# Patient Record
Sex: Male | Born: 1995 | Race: White | Hispanic: No | Marital: Single | Smoking: Never smoker
Health system: Southern US, Community
[De-identification: ages and names within clinical notes are randomized; demographics above are authoritative.]

## PROBLEM LIST (undated history)

## (undated) DIAGNOSIS — N342 Other urethritis: Principal | ICD-10-CM

## (undated) DIAGNOSIS — Z973 Presence of spectacles and contact lenses: Secondary | ICD-10-CM

## (undated) DIAGNOSIS — S83209A Unspecified tear of unspecified meniscus, current injury, unspecified knee, initial encounter: Secondary | ICD-10-CM

## (undated) DIAGNOSIS — Z9889 Other specified postprocedural states: Secondary | ICD-10-CM

## (undated) DIAGNOSIS — K219 Gastro-esophageal reflux disease without esophagitis: Secondary | ICD-10-CM

## (undated) DIAGNOSIS — F32A Depression, unspecified: Secondary | ICD-10-CM

## (undated) DIAGNOSIS — M25561 Pain in right knee: Secondary | ICD-10-CM

## (undated) DIAGNOSIS — F329 Major depressive disorder, single episode, unspecified: Secondary | ICD-10-CM

## (undated) DIAGNOSIS — S8991XD Unspecified injury of right lower leg, subsequent encounter: Secondary | ICD-10-CM

## (undated) HISTORY — DX: Other specified postprocedural states: Z98.890

## (undated) HISTORY — DX: Other urethritis: N34.2

## (undated) HISTORY — PX: OTHER SURGICAL HISTORY: SHX169

## (undated) HISTORY — DX: Unspecified injury of right lower leg, subsequent encounter: S89.91XD

## (undated) HISTORY — DX: Unspecified tear of unspecified meniscus, current injury, unspecified knee, initial encounter: S83.209A

---
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
## 2014-07-16 ENCOUNTER — Ambulatory Visit: Payer: Self-pay | Admitting: Family Medicine

## 2014-11-13 ENCOUNTER — Ambulatory Visit: Payer: Self-pay | Admitting: Otolaryngology

## 2015-03-02 LAB — SURGICAL PATHOLOGY

## 2015-04-03 ENCOUNTER — Other Ambulatory Visit: Payer: Self-pay | Admitting: Orthopedic Surgery

## 2015-04-03 DIAGNOSIS — M2391 Unspecified internal derangement of right knee: Secondary | ICD-10-CM

## 2015-04-14 ENCOUNTER — Ambulatory Visit
Admission: RE | Admit: 2015-04-14 | Discharge: 2015-04-14 | Disposition: A | Discharge status: Home / Self Care | Payer: Managed Care, Other (non HMO) | Source: Ambulatory Visit | Attending: Orthopedic Surgery | Admitting: Orthopedic Surgery

## 2015-04-14 DIAGNOSIS — M9420 Chondromalacia, unspecified site: Secondary | ICD-10-CM | POA: Insufficient documentation

## 2015-04-14 DIAGNOSIS — M25561 Pain in right knee: Secondary | ICD-10-CM | POA: Diagnosis present

## 2015-04-14 DIAGNOSIS — M2391 Unspecified internal derangement of right knee: Secondary | ICD-10-CM

## 2015-04-14 DIAGNOSIS — X58XXXA Exposure to other specified factors, initial encounter: Secondary | ICD-10-CM | POA: Diagnosis not present

## 2015-04-14 DIAGNOSIS — S83241A Other tear of medial meniscus, current injury, right knee, initial encounter: Secondary | ICD-10-CM | POA: Insufficient documentation

## 2015-04-24 DIAGNOSIS — S83209A Unspecified tear of unspecified meniscus, current injury, unspecified knee, initial encounter: Secondary | ICD-10-CM

## 2015-04-24 DIAGNOSIS — S8991XD Unspecified injury of right lower leg, subsequent encounter: Secondary | ICD-10-CM

## 2015-04-24 HISTORY — DX: Unspecified tear of unspecified meniscus, current injury, unspecified knee, initial encounter: S83.209A

## 2015-04-24 HISTORY — DX: Unspecified injury of right lower leg, subsequent encounter: S89.91XD

## 2015-05-27 ENCOUNTER — Encounter: Payer: Self-pay | Admitting: *Deleted

## 2015-06-05 ENCOUNTER — Ambulatory Visit: Payer: Managed Care, Other (non HMO) | Admitting: Student in an Organized Health Care Education/Training Program

## 2015-06-05 ENCOUNTER — Ambulatory Visit
Admission: RE | Admit: 2015-06-05 | Discharge: 2015-06-05 | Disposition: A | Discharge status: Home / Self Care | Payer: Managed Care, Other (non HMO) | Source: Ambulatory Visit | Attending: Unknown Physician Specialty | Admitting: Unknown Physician Specialty

## 2015-06-05 ENCOUNTER — Encounter
Admission: RE | Disposition: A | Discharge status: Home / Self Care | Payer: Self-pay | Source: Ambulatory Visit | Attending: Unknown Physician Specialty

## 2015-06-05 ENCOUNTER — Encounter: Payer: Self-pay | Admitting: Anesthesiology

## 2015-06-05 DIAGNOSIS — Z807 Family history of other malignant neoplasms of lymphoid, hematopoietic and related tissues: Secondary | ICD-10-CM | POA: Insufficient documentation

## 2015-06-05 DIAGNOSIS — Z803 Family history of malignant neoplasm of breast: Secondary | ICD-10-CM | POA: Insufficient documentation

## 2015-06-05 DIAGNOSIS — M25561 Pain in right knee: Secondary | ICD-10-CM | POA: Insufficient documentation

## 2015-06-05 HISTORY — DX: Depression, unspecified: F32.A

## 2015-06-05 HISTORY — DX: Presence of spectacles and contact lenses: Z97.3

## 2015-06-05 HISTORY — DX: Gastro-esophageal reflux disease without esophagitis: K21.9

## 2015-06-05 HISTORY — PX: KNEE ARTHROSCOPY WITH MENISCAL REPAIR: SHX5653

## 2015-06-05 HISTORY — DX: Major depressive disorder, single episode, unspecified: F32.9

## 2015-06-05 HISTORY — DX: Pain in right knee: M25.561

## 2015-06-05 SURGERY — ARTHROSCOPY, KNEE, WITH MENISCUS REPAIR
Anesthesia: General | Laterality: Right | Wound class: Clean

## 2015-06-05 MED ORDER — MIDAZOLAM HCL 5 MG/5ML IJ SOLN
INTRAMUSCULAR | Status: DC | PRN
  Administered 2015-06-05: 2 mg via INTRAVENOUS

## 2015-06-05 MED ORDER — PROMETHAZINE HCL 25 MG/ML IJ SOLN: 6.2500 mg | INTRAMUSCULAR | Status: DC | PRN

## 2015-06-05 MED ORDER — HYDROCODONE-ACETAMINOPHEN 7.5-325 MG PO TABS: 1.0000 | ORAL_TABLET | Freq: Once | ORAL | Status: DC | PRN

## 2015-06-05 MED ORDER — LACTATED RINGERS IV SOLN
INTRAVENOUS | Status: DC
  Administered 2015-06-05: 07:00:00 via INTRAVENOUS

## 2015-06-05 MED ORDER — ONDANSETRON HCL 4 MG/2ML IJ SOLN
INTRAMUSCULAR | Status: DC | PRN
  Administered 2015-06-05: 4 mg via INTRAVENOUS

## 2015-06-05 MED ORDER — NORCO 5-325 MG PO TABS: 1.0000 | ORAL_TABLET | Freq: Four times a day (QID) | ORAL | Status: DC | PRN

## 2015-06-05 MED ORDER — HYDROMORPHONE HCL 1 MG/ML IJ SOLN: 0.2500 mg | INTRAMUSCULAR | Status: DC | PRN

## 2015-06-05 MED ORDER — BUPIVACAINE HCL (PF) 0.5 % IJ SOLN
INTRAMUSCULAR | Status: DC | PRN
Start: 2015-06-05 — End: 2015-06-05
  Administered 2015-06-05: 20 mL

## 2015-06-05 MED ORDER — DEXAMETHASONE SODIUM PHOSPHATE 4 MG/ML IJ SOLN
INTRAMUSCULAR | Status: DC | PRN
  Administered 2015-06-05: 8 mg via INTRAVENOUS

## 2015-06-05 MED ORDER — FENTANYL CITRATE (PF) 100 MCG/2ML IJ SOLN
INTRAMUSCULAR | Status: DC | PRN
  Administered 2015-06-05: 25 ug via INTRAVENOUS
  Administered 2015-06-05: 50 ug via INTRAVENOUS
  Administered 2015-06-05: 25 ug via INTRAVENOUS

## 2015-06-05 MED ORDER — PROPOFOL 10 MG/ML IV BOLUS
INTRAVENOUS | Status: DC | PRN
  Administered 2015-06-05: 200 mg via INTRAVENOUS

## 2015-06-05 MED ORDER — LIDOCAINE HCL (CARDIAC) 20 MG/ML IV SOLN
INTRAVENOUS | Status: DC | PRN
Start: 2015-06-05 — End: 2015-06-05
  Administered 2015-06-05: 40 mg via INTRATRACHEAL

## 2015-06-05 SURGICAL SUPPLY — 38 items
ARTHROWAND PARAGON T2 (SURGICAL WAND)
BLADE ABRADER 4.5 (BLADE) ×3 IMPLANT
BLADE FULL RADIUS 3.5 (BLADE) ×3 IMPLANT
BUR RADIUS 3.5 (BURR) IMPLANT
BUR RADIUS 4.0X18.5 (BURR) IMPLANT
BUR ROUND 5.5 (BURR) IMPLANT
BURR ROUND 12 FLUTE 4.0MM (BURR) IMPLANT
CUFF TOURN SGL QUICK 24 (TOURNIQUET CUFF)
CUFF TOURN SGL QUICK 30 (MISCELLANEOUS)
CUFF TOURN SGL QUICK 34 (TOURNIQUET CUFF)
CUFF TRNQT CYL 24X4X40X1 (TOURNIQUET CUFF) IMPLANT
CUFF TRNQT CYL 34X4X40X1 (TOURNIQUET CUFF) IMPLANT
CUFF TRNQT CYL LO 30X4X (MISCELLANEOUS) IMPLANT
CUTTER SLOTTED WHISKER 4.0 (BURR) IMPLANT
DRAPE LEGGINS SURG 28X43 STRL (DRAPES) ×3 IMPLANT
DURAPREP 26ML APPLICATOR (WOUND CARE) ×3 IMPLANT
GAUZE SPONGE 4X4 12PLY STRL (GAUZE/BANDAGES/DRESSINGS) ×3 IMPLANT
GLOVE BIO SURGEON STRL SZ7.5 (GLOVE) ×3 IMPLANT
GLOVE BIO SURGEON STRL SZ8 (GLOVE) ×3 IMPLANT
GLOVE INDICATOR 8.0 STRL GRN (GLOVE) ×3 IMPLANT
GOWN STRL REIN 2XL XLG LVL4 (GOWN DISPOSABLE) ×3 IMPLANT
GOWN STRL REUS W/TWL 2XL LVL3 (GOWN DISPOSABLE) ×3 IMPLANT
IV LACTATED RINGER IRRG 3000ML (IV SOLUTION) ×4
IV LR IRRIG 3000ML ARTHROMATIC (IV SOLUTION) ×2 IMPLANT
MANIFOLD 4PT FOR NEPTUNE1 (MISCELLANEOUS) ×3 IMPLANT
PACK ARTHROSCOPY KNEE (MISCELLANEOUS) ×3 IMPLANT
SET TUBE SUCT SHAVER OUTFL 24K (TUBING) ×3 IMPLANT
SUT ETHILON 3-0 FS-10 30 BLK (SUTURE) ×3
SUTURE EHLN 3-0 FS-10 30 BLK (SUTURE) ×1 IMPLANT
TAPE MICROFOAM 4IN (TAPE) ×3 IMPLANT
TUBING ARTHRO INFLOW-ONLY STRL (TUBING) ×3 IMPLANT
WAND ARTHRO PARAGON T2 (SURGICAL WAND) IMPLANT
WAND COVAC 50 IFS (MISCELLANEOUS) IMPLANT
WAND HAND CNTRL MULTIVAC 50 (MISCELLANEOUS) IMPLANT
WAND HAND CNTRL MULTIVAC 90 (MISCELLANEOUS) IMPLANT
WAND MEGAVAC 90 (MISCELLANEOUS) IMPLANT
WAND ULTRAVAC 90 (MISCELLANEOUS) IMPLANT
WRAP KNEE W/COLD PACKS 25.5X14 (SOFTGOODS) ×3 IMPLANT

## 2015-06-05 NOTE — Transfer of Care (Signed)
Immediate Anesthesia Transfer of Care Note  Patient: Andrew Fischer  Procedure(s) Performed: Procedure(s): KNEE ARTHROSCOPY WITH MENISCAL REPAIR (Right)  Patient Location: PACU  Anesthesia Type: General  Level of Consciousness: awake, alert  and patient cooperative  Airway and Oxygen Therapy: Patient Spontanous Breathing and Patient connected to supplemental oxygen  Post-op Assessment: Post-op Vital signs reviewed, Patient's Cardiovascular Status Stable, Respiratory Function Stable, Patent Airway and No signs of Nausea or vomiting  Post-op Vital Signs: Reviewed and stable  Complications: No apparent anesthesia complications

## 2015-06-05 NOTE — H&P (Signed)
  H and P reviewed. No changes. Uploaded at later date. 

## 2015-06-05 NOTE — Anesthesia Postprocedure Evaluation (Signed)
  Anesthesia Post-op Note  Patient: Andrew Fischer  Procedure(s) Performed: Procedure(s): KNEE ARTHROSCOPY WITH MENISCAL REPAIR (Right)  Anesthesia type:General  Patient location: PACU  Post pain: Pain level controlled  Post assessment: Post-op Vital signs reviewed, Patient's Cardiovascular Status Stable, Respiratory Function Stable, Patent Airway and No signs of Nausea or vomiting  Post vital signs: Reviewed and stable  Last Vitals:  Filed Vitals:   06/05/15 0849  BP: 99/55  Pulse: 66  Temp: 36.1 C  Resp: 15    Level of consciousness: awake, alert  and patient cooperative  Complications: No apparent anesthesia complications

## 2015-06-05 NOTE — Anesthesia Preprocedure Evaluation (Signed)
Anesthesia Evaluation  Patient identified by MRN, date of birth, ID band Patient awake    Reviewed: Allergy & Precautions, H&P , NPO status , Patient's Chart, lab work & pertinent test results, reviewed documented beta blocker date and time   Airway Mallampati: II  TM Distance: >3 FB Neck ROM: full    Dental no notable dental hx.    Pulmonary neg pulmonary ROS,  breath sounds clear to auscultation  Pulmonary exam normal       Cardiovascular Exercise Tolerance: Good negative cardio ROS  Rhythm:regular Rate:Normal     Neuro/Psych PSYCHIATRIC DISORDERS (Depression) negative neurological ROS     GI/Hepatic Neg liver ROS, GERD-  ,  Endo/Other  negative endocrine ROS  Renal/GU negative Renal ROS  negative genitourinary   Musculoskeletal   Abdominal   Peds  Hematology negative hematology ROS (+)   Anesthesia Other Findings   Reproductive/Obstetrics negative OB ROS                             Anesthesia Physical Anesthesia Plan  ASA: II  Anesthesia Plan: General   Post-op Pain Management:    Induction:   Airway Management Planned:   Additional Equipment:   Intra-op Plan:   Post-operative Plan:   Informed Consent: I have reviewed the patients History and Physical, chart, labs and discussed the procedure including the risks, benefits and alternatives for the proposed anesthesia with the patient or authorized representative who has indicated his/her understanding and acceptance.   Dental Advisory Given  Plan Discussed with: CRNA  Anesthesia Plan Comments:         Anesthesia Quick Evaluation

## 2015-06-05 NOTE — Op Note (Signed)
Preoperative diagnosis: Possible torn medial meniscus right knee  Postop diagnosis: Healed medial meniscus tear on the right  Operation: Diagnostic arthroscopy  Surgeon: Randon Goldsmith, MD  Anesthesia: Gen.   History: Patient's had a long history of right knee pain.  The plain films revealed no bony pathology .  The patient had an MRI which revealed probable posterior horn tear of the medial meniscus.There was also a questionable sprain of the anterior cruciate ligament and PCL on the MRI. The patient was scheduled for surgery due to persistent discomfort despite conservative treatment.  The patient was taken the operating room where satisfactory general anesthesia was achieved. A tourniquet and leg holder were was applied to the right thigh. The right lower extremity was supported with a well leg holder. The right knee was prepped and draped in usual fashion for an arthroscopic procedure. An inflow cannula was introduced superomedially. The joint was distended with lactated Ringer's. Scope was introduced through an inferolateral puncture wound and a probe through an inferomedial puncture wound. Inspection of the medial compartment along with probing of the medial meniscus revealed  possibly a little attenuation of the peripheral attachment of the  posterior horn. Inspection of the intercondylar notch revealed no anterior cruciate ligament or PCL pathology. Inspection of the the lateral compartment revealed no meniscal or chondral pathology.   The scope was introduced through the intercondylar notch into into the posterior recess of the medial and lateral compartments. No additional pathology was noted in the posterior recesses. Trochlear groove was inspected and appeared to be fairly smooth.  Patella surface was smooth. I observed patella tracking from the superomedial portal. The the patella seemed to track fairly well.  The instruments were removed from the joint at this time. The puncture  wounds were closed with 3-0 nylon in vertical mattress fashion. I injected each puncture wound with several cc of half percent Marcaine without epinephrine. Betadine was applied the wounds followed by sterile dressing. An ice pack was applied to the right knee. The patient was awakened and transferred to the stretcher bed. The patient was taken to the recovery room in satisfactory condition.  The tourniquet was not inflated during the course of the procedure. Blood loss was negligible.

## 2015-06-05 NOTE — Discharge Instructions (Signed)
General Anesthesia, Care After °Refer to this sheet in the next few weeks. These instructions provide you with information on caring for yourself after your procedure. Your health care provider may also give you more specific instructions. Your treatment has been planned according to current medical practices, but problems sometimes occur. Call your health care provider if you have any problems or questions after your procedure. °WHAT TO EXPECT AFTER THE PROCEDURE °After the procedure, it is typical to experience: °· Sleepiness. °· Nausea and vomiting. °HOME CARE INSTRUCTIONS °· For the first 24 hours after general anesthesia: °¨ Have a responsible person with you. °¨ Do not drive a car. If you are alone, do not take public transportation. °¨ Do not drink alcohol. °¨ Do not take medicine that has not been prescribed by your health care provider. °¨ Do not sign important papers or make important decisions. °¨ You may resume a normal diet and activities as directed by your health care provider. °· Change bandages (dressings) as directed. °· If you have questions or problems that seem related to general anesthesia, call the hospital and ask for the anesthetist or anesthesiologist on call. °SEEK MEDICAL CARE IF: °· You have nausea and vomiting that continue the day after anesthesia. °· You develop a rash. °SEEK IMMEDIATE MEDICAL CARE IF:  °· You have difficulty breathing. °· You have chest pain. °· You have any allergic problems. °Document Released: 01/30/2001 Document Revised: 10/29/2013 Document Reviewed: 05/09/2013 °[employer]® Patient Information ©2015 [employer]. This information is not intended to replace advice given to you by your health care provider. Make sure you discuss any questions you have with your health care provider. ° ° °[facility]  °Bevan Vu B. Daryana Whirley, Jr., M.D. [phone]  ° °KNEE ARTHROSCOPY POST OPERATION INSTRUCTIONS: ° °PLEASE READ THESE INSTRUCTIONS  ABOUT POST OPERATION CARE. THEY WILL ANSWER MOST OF YOUR QUESTIONS.  °You have been given a prescription for pain. Please take as directed for pain.  °You can walk, keeping the knee slightly stiff-avoid doing too much bending the first day. (if ACL reconstruction is performed, keep brace locked in extension when walking.)  °You will use crutches or cane if needed. Can weight bear as tolerated  °Plan to take three to four days off from work. You can resume work when you are comfortable. (This can be a week or more, depending on the type of work you do.)  °To reduce pain and swelling, place one to two pillows under the knee the first two or three days when sitting or lying. An ice pack may be placed on top of the area over the dressing. Instructions for making homemade icepack are as follow:  °Flexible homemade alcohol water ice pack  °2 cups water  °1 cup rubbing alcohol  °food coloring for the blue tint (optional)  °2 zip-top bags - gallon-size  °Mix the water and alcohol together in one of your zip-top bags and add food coloring. Release as much air as possible and seal the bag. Place in freezer for at least 12 hours.  °The small incisions in your knee are closed with nylon stitches. They will be removed in the office.  °The bulky dressing may be removed in the third day after surgery. (If ACL surgery-DO NOT REMOVE BANDAGES). Put a waterproof band-aid over each stitch. Do not put any creams or ointments on wounds. You may shower at this time, but change waterproof band-aids after showering. KEEP INCISIONS CLEAN AND DRY UNTIL YOU RETURN TO   THE OFFICE.  °Sometimes the operative area remains somewhat painful and swollen for several weeks. This is usually nothing to worry about, but call if you have any excessive symptoms, especially fever. It is not unusual to have a low grade fever of 99 degrees for the first few days. If persist after 3-4 days call the office. It is not uncommon for the pain to be a little worse on  the third day after surgery.  °Begin doing gentle exercises right away. They will be limited by the amount of pain and swelling you have.  Exercising will reduce the swelling, increase motion, and prevent muscle weakness. Exercises: Straight leg raising and gentle knee bending.  °Take 81 milligram aspirin twice a day for 2 weeks after meals or milk. This along with elevation will help reduce the possibility of phlebitis in your operated leg.  °Avoid strenuous athletics for a minimum of 4 to 6 weeks after arthroscopic surgery (approximately five months if ACL surgery).  °If the surgery included ACL reconstruction the brace that is supplied to the extremity post surgery is to be locked in extension when you are asleep and is to be locked in extension when you are ambulating. It can be unlocked for exercises or sitting.  °Keep your post surgery appointment that has been made for you. If you do not remember the date call [phone]. Your follow up appointment should be between 7-10 days.  ° °

## 2015-06-05 NOTE — Anesthesia Procedure Notes (Signed)
Procedure Name: LMA Insertion Date/Time: 06/05/2015 7:40 AM Performed by: Andee Poles Pre-anesthesia Checklist: Patient identified, Patient being monitored, Timeout performed, Emergency Drugs available and Suction available Patient Re-evaluated:Patient Re-evaluated prior to inductionOxygen Delivery Method: Circle system utilized Preoxygenation: Pre-oxygenation with 100% oxygen Intubation Type: IV induction Ventilation: Mask ventilation without difficulty LMA: LMA inserted LMA Size: 4.0 Tube type: Oral Number of attempts: 1 Placement Confirmation: positive ETCO2 and breath sounds checked- equal and bilateral Tube secured with: Tape Dental Injury: Teeth and Oropharynx as per pre-operative assessment

## 2015-06-08 ENCOUNTER — Encounter: Payer: Self-pay | Admitting: Unknown Physician Specialty

## 2015-07-27 DIAGNOSIS — Z9889 Other specified postprocedural states: Secondary | ICD-10-CM

## 2015-07-27 HISTORY — DX: Other specified postprocedural states: Z98.890

## 2015-09-03 IMAGING — CR DG RIBS 2V*L*
5 series · 5 of 5 positions shown · non-contrast
Comparison: None.

CLINICAL DATA: Left lower anterior chest pain

EXAM:
LEFT RIBS - 2 VIEW

[chest pa]
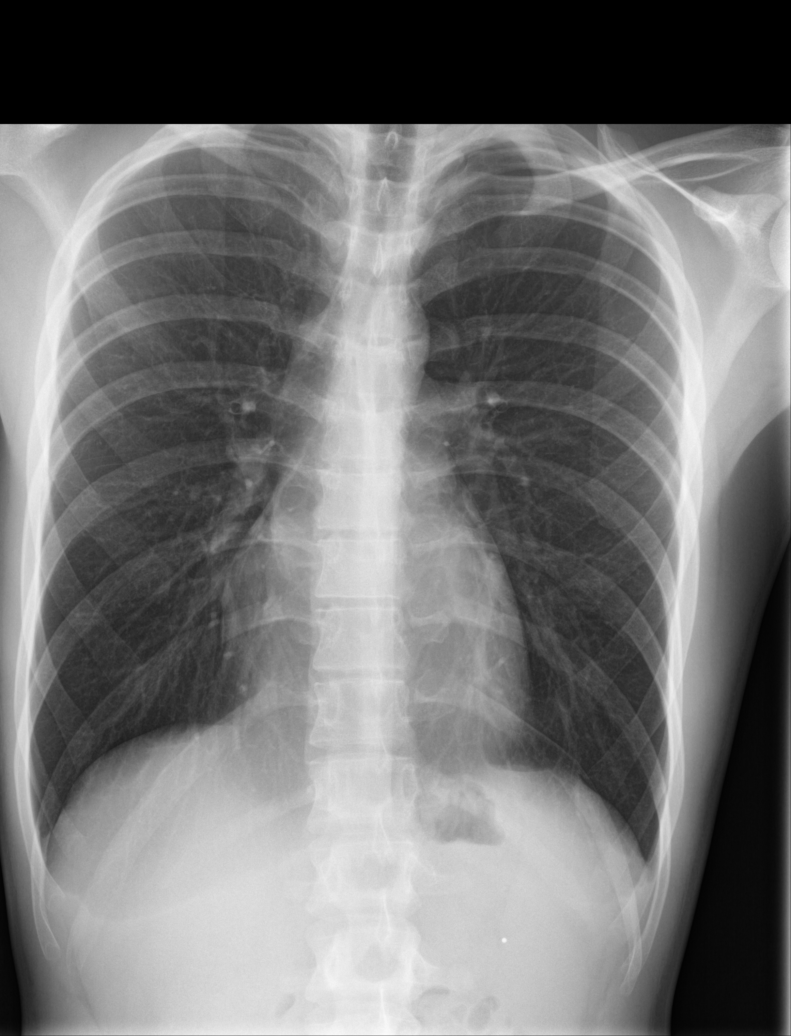

[rib pa (1 of 2)]
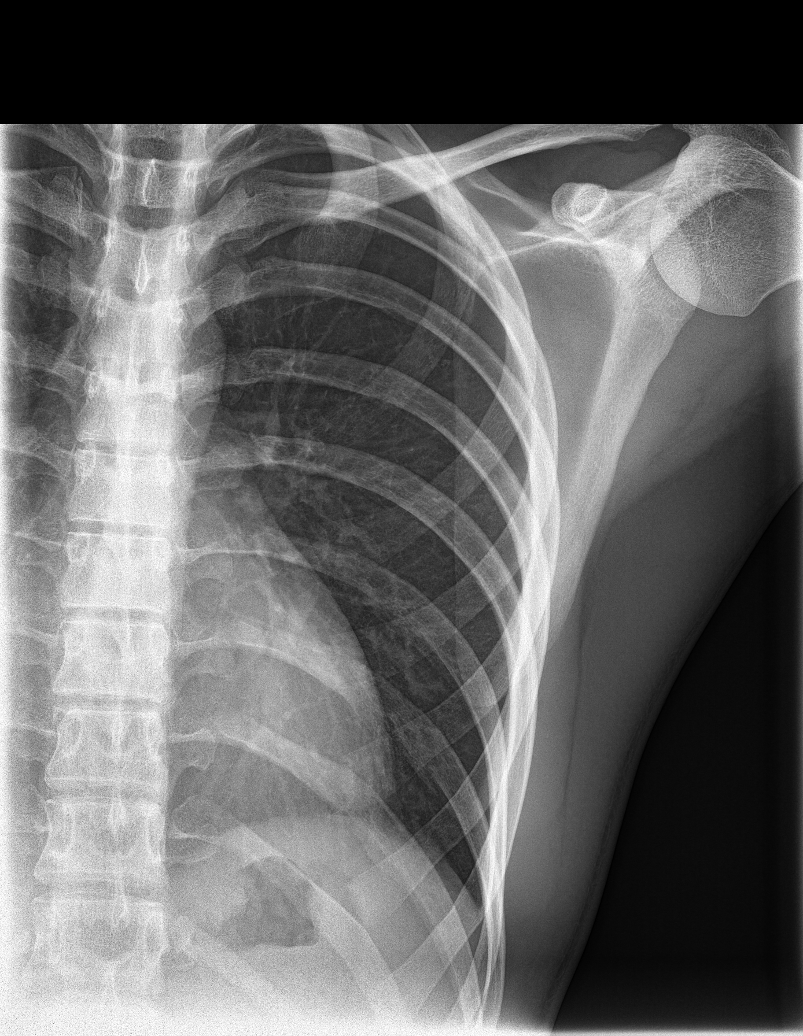

[rib obl (1 of 2)]
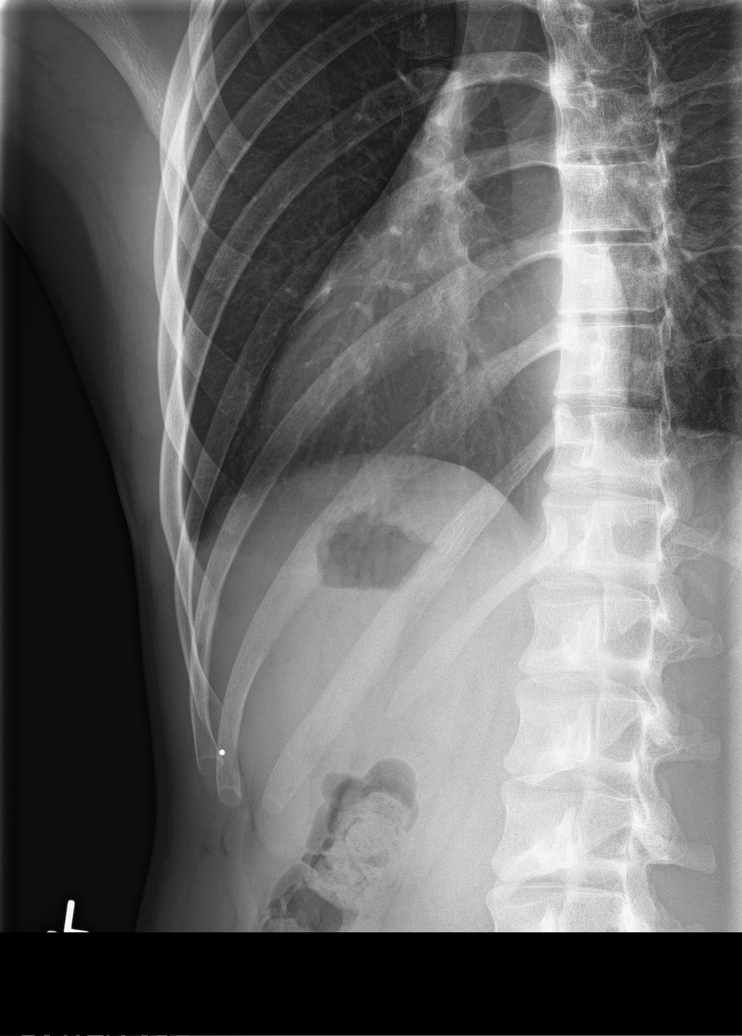

[rib pa (2 of 2)]
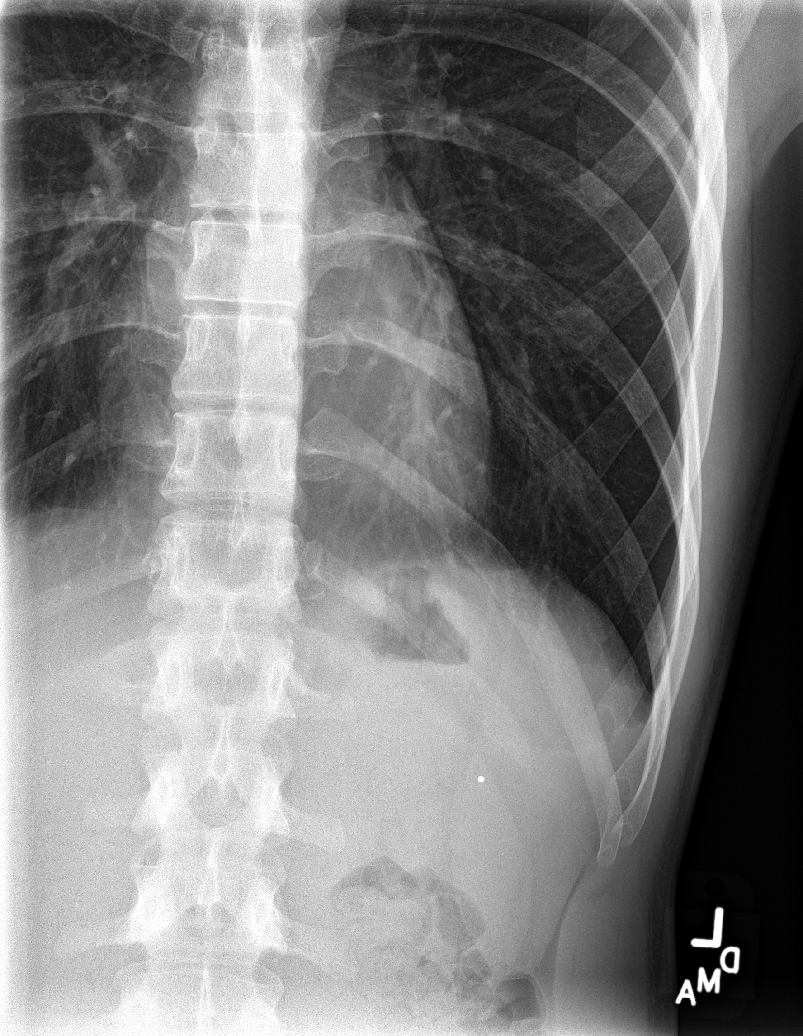

[rib obl (2 of 2)]
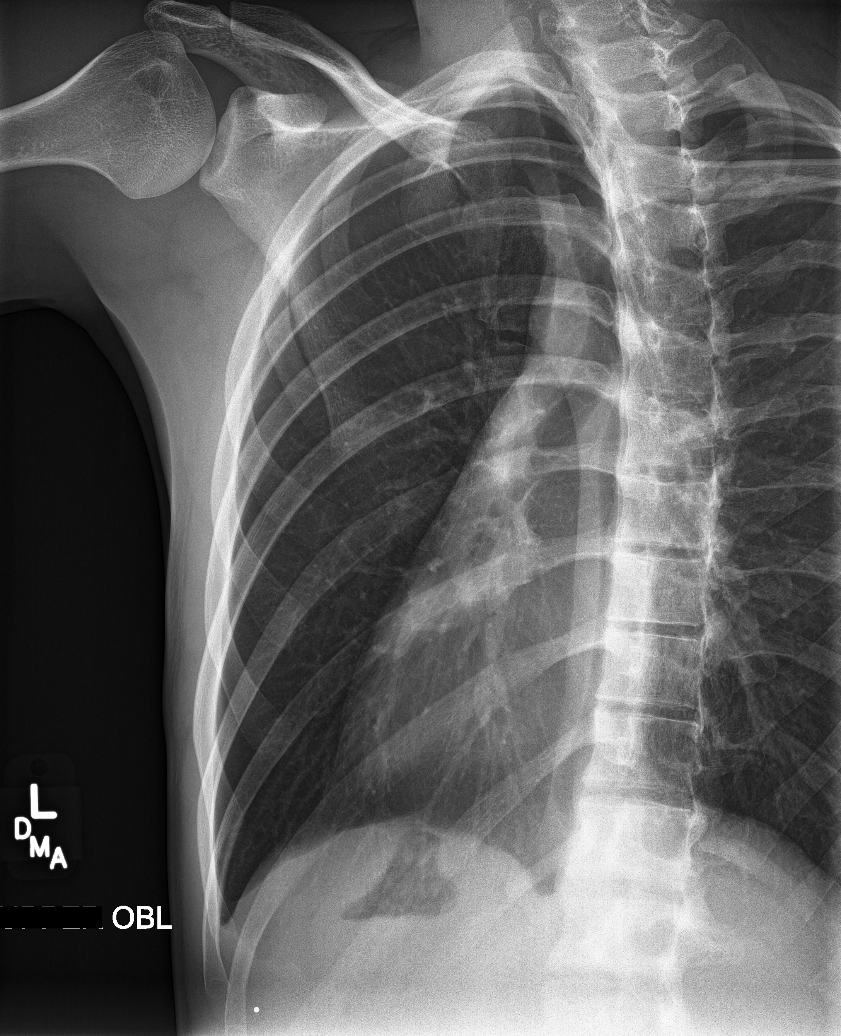

[5 of 5 positions shown; findings below may reference images not displayed]

FINDINGS: The lungs are clear and somewhat hyper aerated. Mediastinal contours
appear normal. The heart is within normal limits in size.

Left rib detail films show no rib abnormality.
IMPRESSION: 1. No active cardiopulmonary disease.
2. Negative left rib detail.

## 2018-06-13 DIAGNOSIS — N342 Other urethritis: Secondary | ICD-10-CM

## 2018-06-13 HISTORY — DX: Other urethritis: N34.2

## 2018-09-10 ENCOUNTER — Encounter: Payer: Self-pay | Admitting: Urology

## 2018-09-10 ENCOUNTER — Ambulatory Visit: Payer: BC Managed Care – PPO | Admitting: Urology

## 2018-09-10 VITALS — BP 107/71 | HR 81 | Ht 66.0 in | Wt 150.0 lb

## 2018-09-10 DIAGNOSIS — N342 Other urethritis: Secondary | ICD-10-CM

## 2018-09-10 LAB — URINALYSIS, COMPLETE
Bilirubin, UA: NEGATIVE
GLUCOSE, UA: NEGATIVE
Ketones, UA: NEGATIVE
Leukocytes, UA: NEGATIVE
Nitrite, UA: NEGATIVE
PROTEIN UA: NEGATIVE
Specific Gravity, UA: 1.025 (ref 1.005–1.030)
Urobilinogen, Ur: 0.2 mg/dL (ref 0.2–1.0)
pH, UA: 6.5 (ref 5.0–7.5)

## 2018-09-10 LAB — MICROSCOPIC EXAMINATION
Bacteria, UA: NONE SEEN
Epithelial Cells (non renal): NONE SEEN /hpf (ref 0–10)
WBC, UA: NONE SEEN /hpf (ref 0–5)

## 2018-09-10 MED ORDER — MELOXICAM 15 MG PO TABS: 15.0000 mg | ORAL_TABLET | Freq: Every day | ORAL | 1 refills | Status: DC

## 2018-09-10 MED ORDER — CIPROFLOXACIN HCL 500 MG PO TABS: 500.0000 mg | ORAL_TABLET | Freq: Two times a day (BID) | ORAL | 0 refills | Status: DC

## 2018-09-10 NOTE — Progress Notes (Signed)
09/10/2018 12:57 PM   Andrew Fischer 09-16-1996 [id number]  Referring provider: Rolm Gala, MD [address] [place], [place] [address]  Chief Complaint  Patient presents with  . urethritis    New Patient    HPI: I was consulted to assist the patient intermittent burning.  He will get a slight sting at the end of the penis that comes and goes.  Sometimes he has pain with ejaculation.  In June he was treated for 10 days with an antibiotic with no help.  He has been cleared for sexually transmitted disease.  He has no discharge.  Over his lifetime he has some splitting of the stream but does not miss the toilet  At baseline his flow is stronger voids every 2 or 3 hours and gets up once a night  He denies a history of urinary tract infections and previous GU surgery and has no neurologic issues.  Presentation has otherwise not been treated  Modifying factors: There are no other modifying factors  Associated signs and symptoms: There are no other associated signs and symptoms Aggravating and relieving factors: There are no other aggravating or relieving factors Severity: Moderate Duration: Persistent   PMH: Past Medical History:  Diagnosis Date  . Depression   . GERD (gastroesophageal reflux disease)   . Right knee pain    Torn meniscus  . Wears contact lenses     Surgical History: Past Surgical History:  Procedure Laterality Date  . KNEE ARTHROSCOPY WITH MENISCAL REPAIR Right 06/05/2015   Procedure: KNEE ARTHROSCOPY WITH MENISCAL REPAIR;  Surgeon: Erin Sons, MD;  Location: [facility];  Service: Orthopedics;  Laterality: Right;  . sinusplasty      Home Medications:  Allergies as of 09/10/2018   No Known Allergies     Medication List        Accurate as of 09/10/18 12:57 PM. Always use your most recent med list.          acetaminophen 500 MG tablet Commonly known as:  TYLENOL Take 500 mg by mouth every 6 (six) hours as needed.     citalopram 20 MG tablet Commonly known as:  CELEXA   DAYQUIL MULTI-SYMPTOM PO Take by mouth as needed.   naproxen 500 MG tablet Commonly known as:  NAPROSYN Take 500 mg by mouth 2 (two) times daily with a meal.   NORCO 5-325 MG tablet Generic drug:  HYDROcodone-acetaminophen Take 1-2 tablets by mouth every 6 (six) hours as needed for moderate pain. MAXIMUM TOTAL ACETAMINOPHEN DOSE IS 4000 MG PER DAY   polyethylene glycol packet Commonly known as:  MIRALAX / GLYCOLAX Take 17 g by mouth daily.   ranitidine 75 MG tablet Commonly known as:  ZANTAC Take 75 mg by mouth as needed for heartburn.       Allergies: No Known Allergies  Family History: No family history on file.  Social History:  reports that he has never smoked. He has never used smokeless tobacco. He reports that he does not drink alcohol or use drugs.  ROS: UROLOGY Frequent Urination?: No Hard to postpone urination?: No Burning/pain with urination?: Yes Get up at night to urinate?: No Leakage of urine?: No Urine stream starts and stops?: No Trouble starting stream?: No Do you have to strain to urinate?: No Blood in urine?: No Urinary tract infection?: No Sexually transmitted disease?: No Injury to kidneys or bladder?: No Painful intercourse?: No Weak stream?: No Erection problems?: No Penile pain?: No  Gastrointestinal Nausea?:  No Vomiting?: No Indigestion/heartburn?: No Diarrhea?: No Constipation?: No  Constitutional Fever: No Night sweats?: No Weight loss?: No Fatigue?: No  Skin Skin rash/lesions?: No Itching?: No  Eyes Blurred vision?: No Double vision?: No  Ears/Nose/Throat Sore throat?: No Sinus problems?: No  Hematologic/Lymphatic Swollen glands?: No Easy bruising?: No  Cardiovascular Leg swelling?: No Chest pain?: No  Respiratory Cough?: No Shortness of breath?: No  Endocrine Excessive thirst?: No  Musculoskeletal Back pain?: No Joint pain?:  No  Neurological Headaches?: No Dizziness?: No  Psychologic Depression?: No Anxiety?: No  Physical Exam: BP 107/71   Pulse 81   Ht 5\' 6"  (1.676 m)   Wt 68 kg   BMI 24.21 kg/m   Constitutional:  Alert and oriented, No acute distress. HEENT: [place] AT, moist mucus membranes.  Trachea midline, no masses. Cardiovascular: No clubbing, cyanosis, or edema. Respiratory: Normal respiratory effort, no increased work of breathing. GI: Abdomen is soft, nontender, nondistended, no abdominal masses GU: Normal male genitalia.  Normal meatus and no palpable structure Skin: No rashes, bruises or suspicious lesions. Lymph: No cervical or inguinal adenopathy. Neurologic: Grossly intact, no focal deficits, moving all 4 extremities. Psychiatric: Normal mood and affect.  Laboratory Data: No results found for: WBC, HGB, HCT, MCV, PLT  No results found for: CREATININE  No results found for: PSA  No results found for: TESTOSTERONE  No results found for: HGBA1C  Urinalysis No results found for: COLORURINE, APPEARANCEUR, LABSPEC, PHURINE, GLUCOSEU, HGBUR, BILIRUBINUR, KETONESUR, PROTEINUR, UROBILINOGEN, NITRITE, LEUKOCYTESUR  Pertinent Imaging:    Assessment & Plan: The patient understood he may have mild prostatitis and/or urethritis.  The role of anti-inflammatories in 1 month course of antibiotics discussed the chronic nature discussed.  The role of cystoscopy mentioned.  Ciprofloxacin 500 mg twice a day for 3 days.  Mobic 15 mg once a day with 30 tablets and 1 refill.  Side effect discussed reassess in 6 weeks  1. Urethritis  - Urinalysis, Complete   No follow-ups on file.  Martina Sinner, MD  [facility] [address], [address] [place], [place] [address] [phone]

## 2018-09-13 LAB — CULTURE, URINE COMPREHENSIVE

## 2018-09-14 ENCOUNTER — Telehealth: Payer: Self-pay | Admitting: Urology

## 2018-09-14 NOTE — Telephone Encounter (Signed)
Pt called office asking about U/A and culture results from 11/4.  Please give pt a call.

## 2018-09-18 NOTE — Telephone Encounter (Signed)
Spoke to patient this morning. I informed him the urine was ok. Patient states he feels he is having a allergic reaction to Cipro or Mobic. I asked what the symptoms were and he states he has bumps on his penis that are red with white heads. I informed him that this is not a typical reaction to and ati-inflamatory or ABX.  I informed him to see his PCP to have the lesions looked at and possibly tested.

## 2018-10-17 DIAGNOSIS — F418 Other specified anxiety disorders: Secondary | ICD-10-CM | POA: Insufficient documentation

## 2019-03-13 ENCOUNTER — Other Ambulatory Visit: Payer: Self-pay | Admitting: Gastroenterology

## 2019-03-13 DIAGNOSIS — R110 Nausea: Secondary | ICD-10-CM

## 2019-03-13 DIAGNOSIS — R109 Unspecified abdominal pain: Secondary | ICD-10-CM

## 2019-03-22 ENCOUNTER — Other Ambulatory Visit: Payer: Managed Care, Other (non HMO)

## 2019-03-28 ENCOUNTER — Other Ambulatory Visit: Payer: Self-pay

## 2019-04-08 ENCOUNTER — Ambulatory Visit
Admission: RE | Admit: 2019-04-08 | Discharge: 2019-04-08 | Disposition: A | Discharge status: Home / Self Care | Payer: BC Managed Care – PPO | Source: Ambulatory Visit | Attending: Gastroenterology | Admitting: Gastroenterology

## 2019-04-08 DIAGNOSIS — R109 Unspecified abdominal pain: Secondary | ICD-10-CM

## 2019-04-08 DIAGNOSIS — R110 Nausea: Secondary | ICD-10-CM

## 2019-09-17 DIAGNOSIS — K219 Gastro-esophageal reflux disease without esophagitis: Secondary | ICD-10-CM | POA: Insufficient documentation

## 2020-05-26 IMAGING — US ULTRASOUND ABDOMEN COMPLETE
1 series · 14 of 25 positions shown · non-contrast
Comparison: None.

CLINICAL DATA: Abdominal pain

EXAM:
ABDOMEN ULTRASOUND COMPLETE

[Series 1: ultrasound abdomen complete · 0.17mm/px · 14 of 73 slices shown]
[im 1/73]
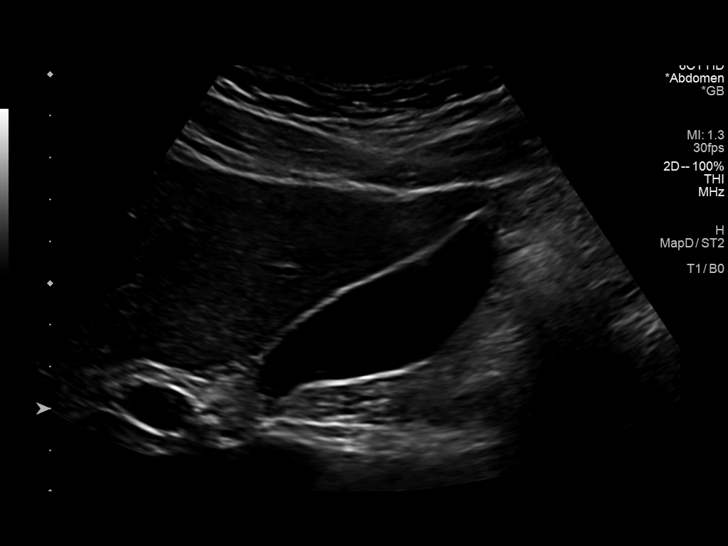
[im 7/73]
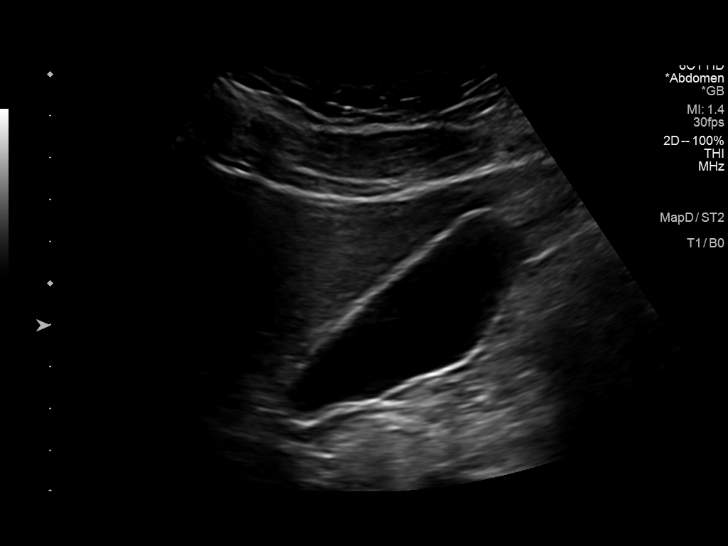
[im 13/73]
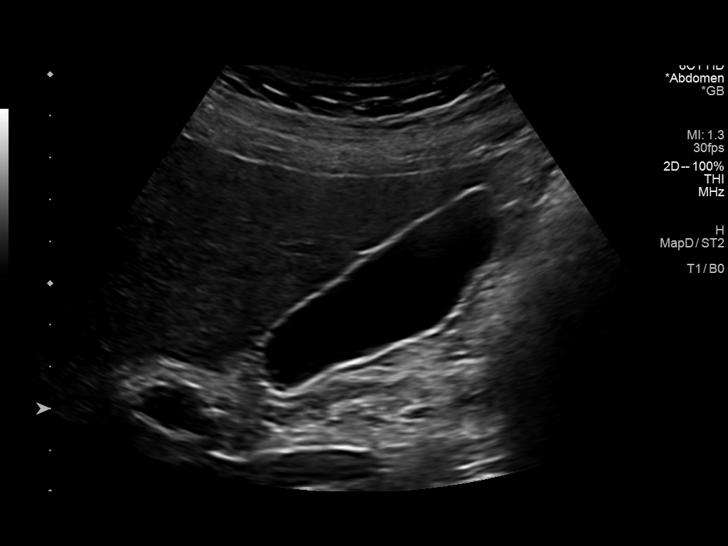
[im 19/73]
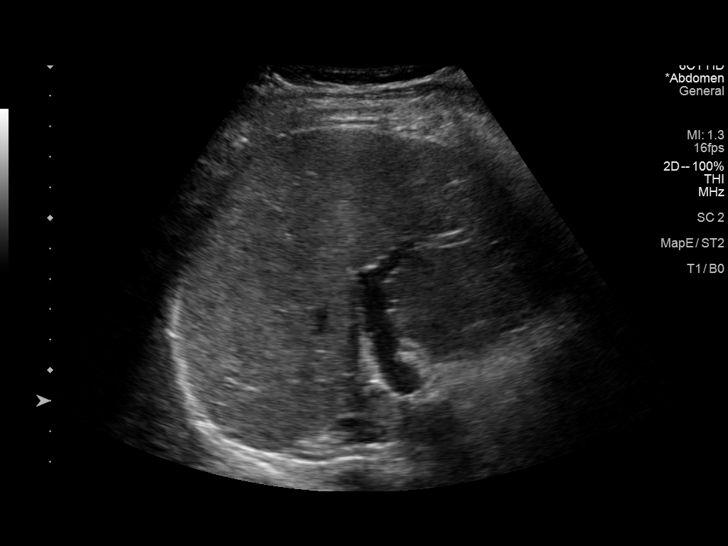
[im 25/73]
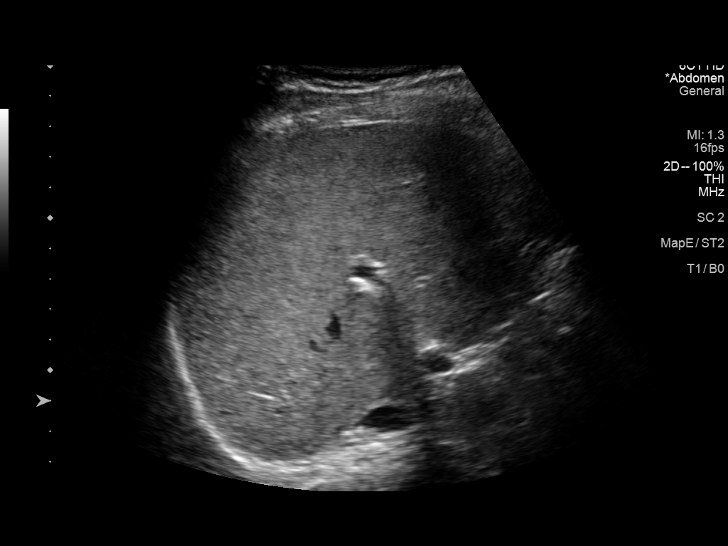
[im 28/73]
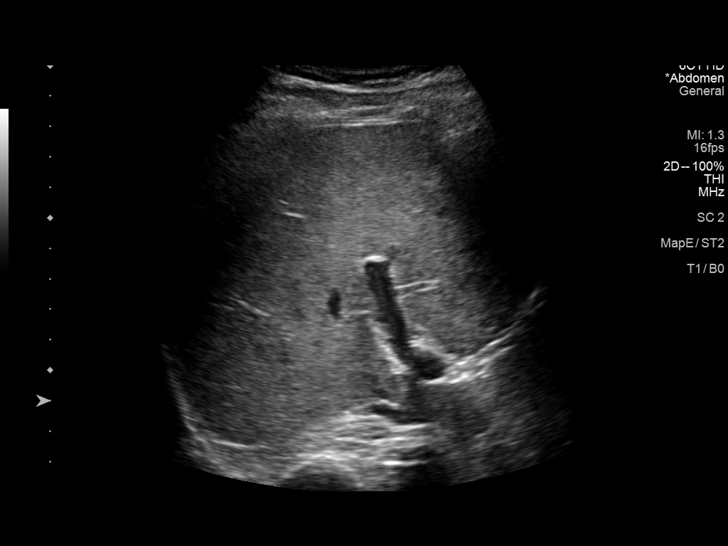
[im 34/73]
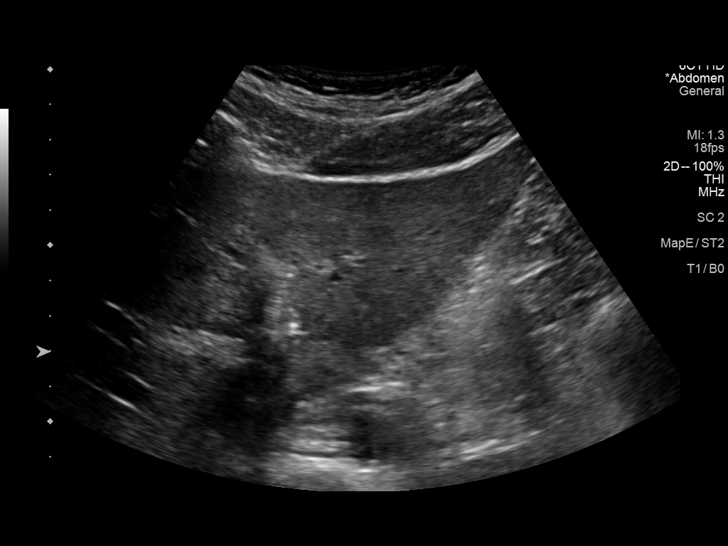
[im 40/73]
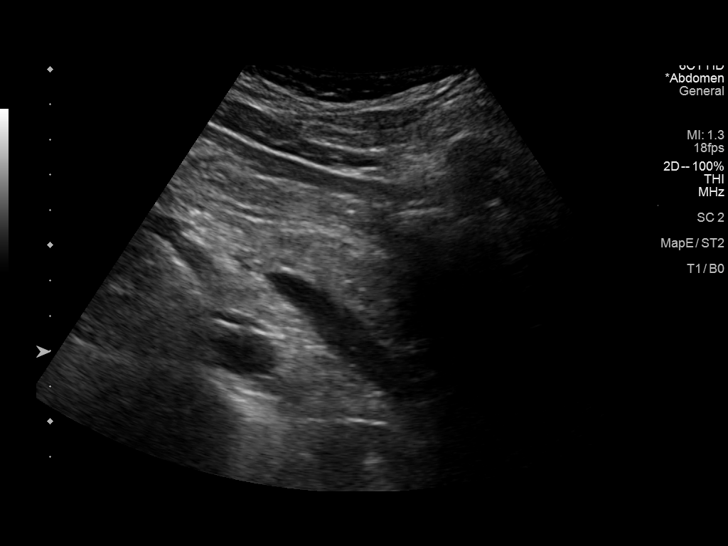
[im 46/73]
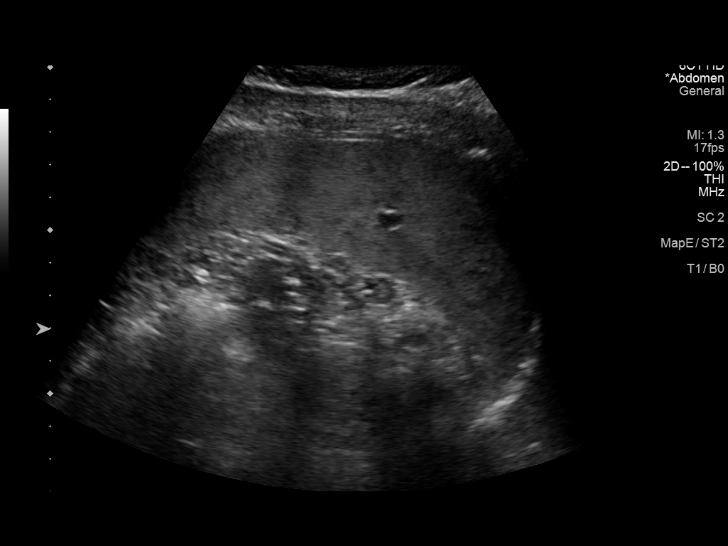
[im 49/73]
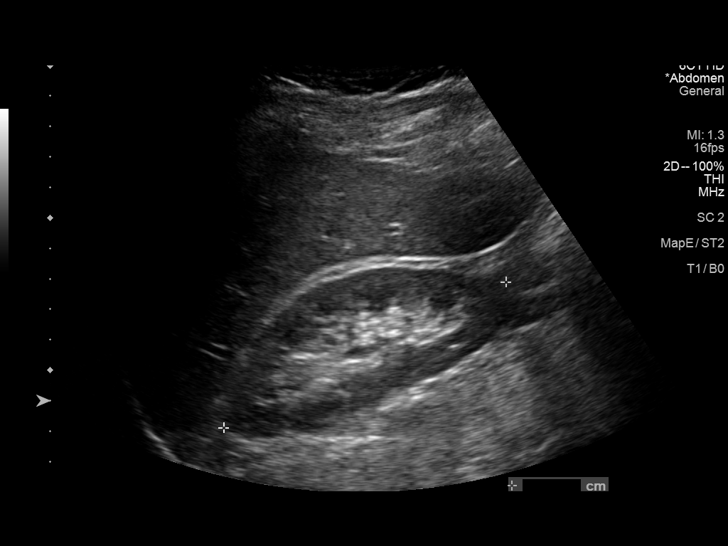
[im 55/73]
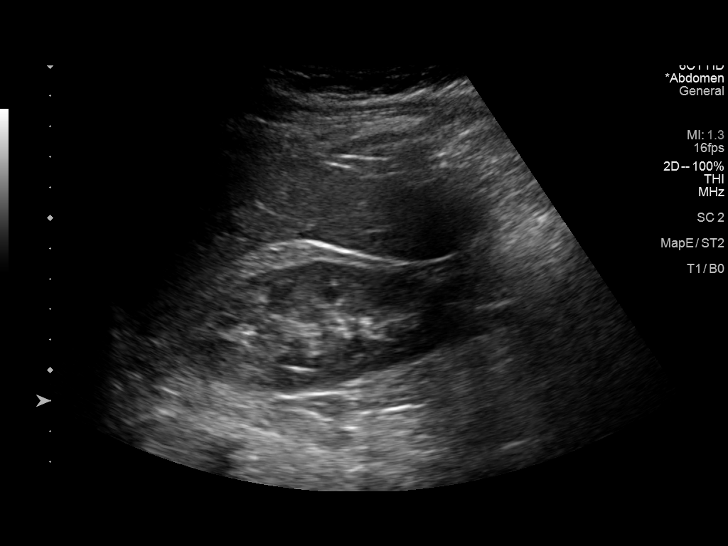
[im 61/73]
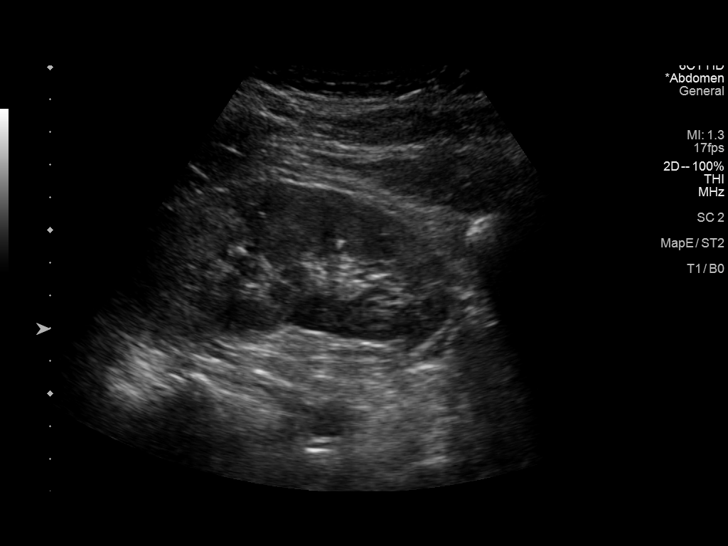
[im 67/73]
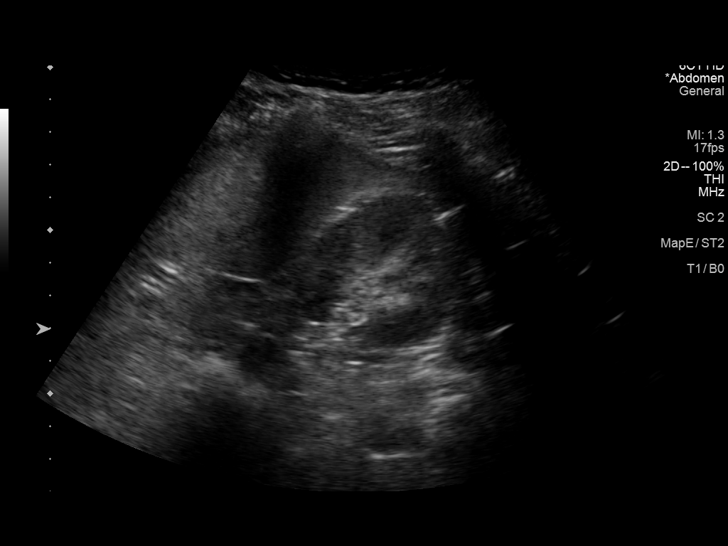
[im 73/73]
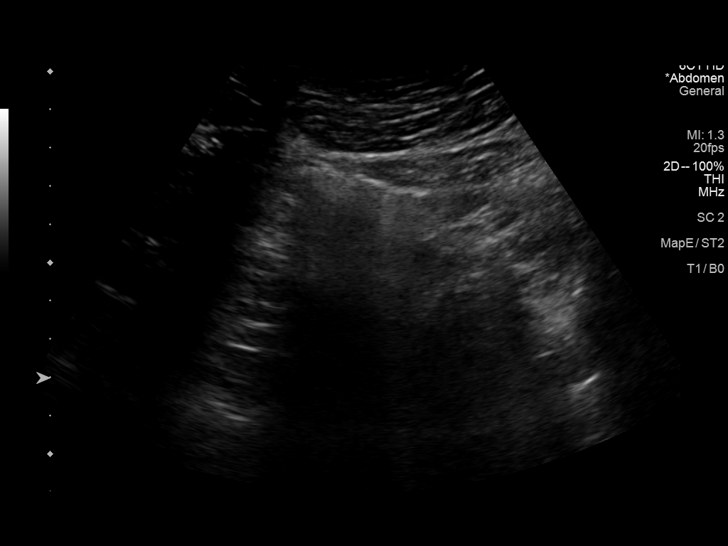

[14 of 25 positions shown; findings below may reference images not displayed]

FINDINGS: Gallbladder: No gallstones or wall thickening visualized. There is
no pericholecystic fluid. No sonographic Murphy sign noted by
sonographer.

Common bile duct: Diameter: 2 mm. No intrahepatic, common hepatic,
or common bile duct dilatation.

Liver: No focal lesion identified. Within normal limits in
parenchymal echogenicity. Portal vein is patent on color Doppler
imaging with normal direction of blood flow towards the liver.

IVC: No abnormality visualized.

Pancreas: Visualized portion unremarkable. Portions of the pancreas
obscured by gas.

Spleen: Size and appearance within normal limits.

Right Kidney: Length: 10.4 cm. Echogenicity within normal limits. No
mass or hydronephrosis visualized.

Left Kidney: Length: 11.1 cm. Echogenicity within normal limits. No
mass or hydronephrosis visualized.

Abdominal aorta: No aneurysm visualized.

Other findings: No demonstrable ascites.
IMPRESSION: Portions of pancreas obscured by gas. Visualized portions of
pancreas appear normal.

Study otherwise unremarkable.

## 2021-04-19 ENCOUNTER — Ambulatory Visit
Admission: EM | Admit: 2021-04-19 | Discharge: 2021-04-19 | Disposition: A | Discharge status: Home / Self Care | Payer: PRIVATE HEALTH INSURANCE | Attending: Family Medicine | Admitting: Family Medicine

## 2021-04-19 ENCOUNTER — Encounter: Payer: Self-pay | Admitting: Emergency Medicine

## 2021-04-19 ENCOUNTER — Other Ambulatory Visit: Payer: Self-pay

## 2021-04-19 DIAGNOSIS — T63441A Toxic effect of venom of bees, accidental (unintentional), initial encounter: Secondary | ICD-10-CM

## 2021-04-19 MED ORDER — TRIAMCINOLONE ACETONIDE 0.5 % EX OINT: 1.0000 "application " | TOPICAL_OINTMENT | Freq: Two times a day (BID) | CUTANEOUS | 0 refills | Status: DC

## 2021-04-19 NOTE — ED Provider Notes (Signed)
[facility]    CSN: [id number] Arrival date & time: 04/19/21  0845      History   Chief Complaint Chief Complaint  Patient presents with   Insect Bite    Right big toe   HPI  25 year old male presents with the above complaint.  Patient states that 2 days ago he suffered a bee sting to the right great toe.  He states that he removed the stinger but believes that there is a portion still in his toe.  He has a small blister to the medial aspect of his right great toe.  He states that the area is swollen and is painful and itches.  Pain 3/10 in severity.  No relieving factors.  No documented fever.  No other associated symptoms.  No other complaints.   Past Medical History:  Diagnosis Date   Depression    GERD (gastroesophageal reflux disease)    PCL injury, right, subsequent encounter 04/24/2015   Right knee pain    Torn meniscus   Status post arthroscopic surgery of right knee 07/27/2015   Tear of meniscus of knee 04/24/2015   Urethritis 06/13/2018   Wears contact lenses     Patient Active Problem List   Diagnosis Date Noted   Urethritis 06/13/2018   Status post arthroscopic surgery of right knee 07/27/2015   PCL injury, right, subsequent encounter 04/24/2015   Tear of meniscus of knee 04/24/2015    Past Surgical History:  Procedure Laterality Date   KNEE ARTHROSCOPY WITH MENISCAL REPAIR Right 06/05/2015   Procedure: KNEE ARTHROSCOPY WITH MENISCAL REPAIR;  Surgeon: Erin Sons, MD;  Location: [facility];  Service: Orthopedics;  Laterality: Right;   sinusplasty      Home Medications    Prior to Admission medications   Medication Sig Start Date End Date Taking? Authorizing Provider  pantoprazole (PROTONIX) 40 MG tablet Take by mouth. 04/17/19  Yes [provider]  triamcinolone ointment (KENALOG) 0.5 % Apply 1 application topically 2 (two) times daily. 04/19/21  Yes Tommie Sams, DO    Family History Family History  Problem Relation  Age of Onset   Healthy Mother     Social History Social History   Tobacco Use   Smoking status: Never   Smokeless tobacco: Never  Substance Use Topics   Alcohol use: No   Drug use: Never     Allergies   Patient has no known allergies.   Review of Systems Review of Systems  Constitutional: Negative.   Skin:        Bee sting.    Physical Exam Triage Vital Signs ED Triage Vitals  Enc Vitals Group     BP 04/19/21 0902 112/77     Pulse Rate 04/19/21 0902 71     Resp 04/19/21 0902 16     Temp 04/19/21 0902 98.3 F (36.8 C)     Temp Source 04/19/21 0902 Oral     SpO2 04/19/21 0902 100 %     Weight --      Height --      Head Circumference --      Peak Flow --      Pain Score 04/19/21 0856 3     Pain Loc --      Pain Edu? --      Excl. in GC? --    Updated Vital Signs BP 112/77   Pulse 71   Temp 98.3 F (36.8 C) (Oral)   Resp 16   SpO2  100%   Visual Acuity Right Eye Distance:   Left Eye Distance:   Bilateral Distance:    Right Eye Near:   Left Eye Near:    Bilateral Near:     Physical Exam Vitals and nursing note reviewed.  Constitutional:      General: He is not in acute distress.    Appearance: Normal appearance. He is not ill-appearing.  HENT:     Head: Normocephalic and atraumatic.  Eyes:     General:        Right eye: No discharge.        Left eye: No discharge.     Conjunctiva/sclera: Conjunctivae normal.  Pulmonary:     Effort: Pulmonary effort is normal. No respiratory distress.  Skin:    Comments: Right great toe - small blister noted. No significant erythema/warmth.   Neurological:     Mental Status: He is alert.  Psychiatric:        Mood and Affect: Mood normal.        Behavior: Behavior normal.     UC Treatments / Results  Labs (all labs ordered are listed, but only abnormal results are displayed) Labs Reviewed - No data to display  EKG   Radiology No results found.  Procedures Procedures (including critical care  time)  Medications Ordered in UC Medications - No data to display  Initial Impression / Assessment and Plan / UC Course  I have reviewed the triage vital signs and the nursing notes.  Pertinent labs & imaging results that were available during my care of the patient were reviewed by me and considered in my medical decision making (see chart for details).    25 year old male presents with a bee sting. Localized reaction. No evidence of infection. Treating with Triamcinolone.  Final Clinical Impressions(s) / UC Diagnoses   Final diagnoses:  Bee sting, accidental or unintentional, initial encounter     Discharge Instructions      Keep the area clean.  Medication as directed.  The area does not look infected. This should resolve soon.  Take care  Dr. Adriana Simas    ED Prescriptions     Medication Sig Dispense Auth. Provider   triamcinolone ointment (KENALOG) 0.5 % Apply 1 application topically 2 (two) times daily. 30 g Tommie Sams, DO      PDMP not reviewed this encounter.   Tommie Sams, [place] 04/19/21 1007

## 2021-04-19 NOTE — ED Triage Notes (Signed)
Pt presents today with bee sting to right big toe x 2 days. Continues to be painful with swelling.

## 2021-04-19 NOTE — Discharge Instructions (Addendum)
Keep the area clean.  Medication as directed.  The area does not look infected. This should resolve soon.  Take care  Dr. Adriana Simas

## 2022-03-17 ENCOUNTER — Ambulatory Visit (INDEPENDENT_AMBULATORY_CARE_PROVIDER_SITE_OTHER): Payer: BC Managed Care – PPO

## 2022-03-17 ENCOUNTER — Ambulatory Visit
Admission: EM | Admit: 2022-03-17 | Discharge: 2022-03-17 | Disposition: A | Discharge status: Home / Self Care | Payer: BC Managed Care – PPO | Attending: Emergency Medicine | Admitting: Emergency Medicine

## 2022-03-17 DIAGNOSIS — J029 Acute pharyngitis, unspecified: Secondary | ICD-10-CM | POA: Diagnosis present

## 2022-03-17 DIAGNOSIS — J020 Streptococcal pharyngitis: Secondary | ICD-10-CM | POA: Diagnosis present

## 2022-03-17 DIAGNOSIS — B2790 Infectious mononucleosis, unspecified without complication: Secondary | ICD-10-CM | POA: Insufficient documentation

## 2022-03-17 LAB — GROUP A STREP BY PCR: Group A Strep by PCR: NOT DETECTED

## 2022-03-17 LAB — MONONUCLEOSIS SCREEN: Mono Screen: POSITIVE — AB

## 2022-03-17 MED ORDER — IBUPROFEN 600 MG PO TABS: 600.0000 mg | ORAL_TABLET | Freq: Four times a day (QID) | ORAL | 0 refills | Status: AC | PRN

## 2022-03-17 NOTE — ED Provider Notes (Signed)
?HPI ? ?SUBJECTIVE: ? ?Patient reports sore throat starting 1 week ago.  States the back of his tongue also hurts and feels swollen.  Sx worse with swallowing, cold drinks and with talking.  Sx better with hot drinks, rest, Tylenol.  ?No fever ?+ Swollen neck glands   ?No neck stiffness  ?+Cough.  No shortness of breath, wheezing ?No nasal congestion, rhinorrhea ?+ Postnasal drip ?No Myalgias ?No Headache ?No Rash ? ?No loss of taste or smell ?No shortness of breath or difficulty breathing ?No nausea, vomiting ?No diarrhea ?No abdominal pain ?    ?No Recent Strep, mono, flu, COVID exposure ?Got the flu vaccine.  Did not get the COVID-vaccine. ?No reflux sxs ?No Allergy sxs ? ?No Breathing difficulty,  sensation of throat swelling shut ?+ Deep, "a little bit of" muffled voice ?No Drooling ?No Trismus ?No abx in past month.  ?No antipyretic in past 4-6 hrs ?He has a past medical history of GERD, which is not bothering him and allergies for which he takes Zyrtec.   ?PCP: None ?Of note, he is a Emergency planning/management officerpolice officer. ? ? ?Past Medical History:  ?Diagnosis Date  ? Depression   ? GERD (gastroesophageal reflux disease)   ? PCL injury, right, subsequent encounter 04/24/2015  ? Right knee pain   ? Torn meniscus  ? Status post arthroscopic surgery of right knee 07/27/2015  ? Tear of meniscus of knee 04/24/2015  ? Urethritis 06/13/2018  ? Wears contact lenses   ? ? ?Past Surgical History:  ?Procedure Laterality Date  ? KNEE ARTHROSCOPY WITH MENISCAL REPAIR Right 06/05/2015  ? Procedure: KNEE ARTHROSCOPY WITH MENISCAL REPAIR;  Surgeon: Erin SonsHarold Kernodle, MD;  Location[facility];  Service: Orthopedics;  Laterality: Right;  ? sinusplasty    ? ? ?Family History  ?Problem Relation Age of Onset  ? Healthy Mother   ? ? ?Social History  ? ?Tobacco Use  ? Smoking status: Never  ? Smokeless tobacco: Never  ?Substance Use Topics  ? Alcohol use: No  ? Drug use: Never  ? ? ?No current facility-administered medications for this  encounter. ? ?Current Outpatient Medications:  ?  ibuprofen (ADVIL) 600 MG tablet, Take 1 tablet (600 mg total) by mouth every 6 (six) hours as needed., Disp: 30 tablet, Rfl: 0 ?  pantoprazole (PROTONIX) 40 MG tablet, Take by mouth., Disp: , Rfl:  ?  traZODone (DESYREL) 50 MG tablet, Take 1 tablet by mouth at bedtime as needed., Disp: , Rfl:  ?  pantoprazole (PROTONIX) 40 MG tablet, Take by mouth., Disp: , Rfl:  ?  traZODone (DESYREL) 50 MG tablet, Take 50 mg by mouth at bedtime., Disp: , Rfl:  ? ?No Known Allergies ? ? ?ROS ? ?As noted in HPI.  ? ?Physical Exam ? ?BP (!) 107/98 (BP Location: Left Arm)   Pulse 81   Temp 98.2 ?F (36.8 ?C) (Oral)   Resp 16   SpO2 99%  ? ?Constitutional: Well developed, well nourished, no acute distress.  Mildly muffled voice. ?Eyes:  EOMI, conjunctiva normal bilaterally ?HENT: Normocephalic, atraumatic,mucus membranes moist. +erythematous oropharynx - enlarged tonsils - exudates. Uvula midline.Marland Kitchen.  Positive cobblestoning.  No postnasal drip.  No drooling, trismus, stridor. ?Respiratory: Normal inspiratory effort, lungs clear bilaterally ?Cardiovascular: Normal rate, no murmurs, rubs, gallops ?GI: nondistended, nontender. No appreciable splenomegaly ?skin: No rash, skin intact ?Lymph: No anterior cervical LN. positive posterior cervical lymphadenopathy ?Musculoskeletal: no deformities ?Neurologic: Alert & oriented x 3, no focal neuro deficits ?Psychiatric: Speech and behavior  appropriate.  ? ?ED Course ? ? ?Medications - No data to display ? ?Orders Placed This Encounter  ?Procedures  ? Group A Strep by PCR  ?  Standing Status:   Standing  ?  Number of Occurrences:   1  ?  Order Specific Question:   Patient immune status  ?  Answer:   Immunocompromised  ?  Order Specific Question:   Release to patient  ?  Answer:   Immediate  ? DG Neck Soft Tissue  ?  Standing Status:   Standing  ?  Number of Occurrences:   1  ?  Order Specific Question:   Reason for Exam (SYMPTOM  OR DIAGNOSIS  REQUIRED)  ?  Answer:   Sore throat 1 week, slightly muffled voice.  Rule out epiglottitis, retropharyngeal abscess  ? Mononucleosis screen  ?  Standing Status:   Standing  ?  Number of Occurrences:   1  ? ? ?Results for orders placed or performed during the hospital encounter of 03/17/22 (from the past 24 hour(s))  ?Group A Strep by PCR     Status: None  ? Collection Time: 03/17/22  9:37 AM  ? Specimen: Throat; Sterile Swab  ?Result Value Ref Range  ? Group A Strep by PCR NOT DETECTED NOT DETECTED  ?Mononucleosis screen     Status: Abnormal  ? Collection Time: 03/17/22 10:42 AM  ?Result Value Ref Range  ? Mono Screen POSITIVE (A) NEGATIVE  ? ?DG Neck Soft Tissue ? ?Result Date: 03/17/2022 ?CLINICAL DATA:  Sore throat x1 week EXAM: NECK SOFT TISSUES - 1+ VIEW COMPARISON:  None Available. FINDINGS: There is no significant narrowing of the airways. Anterior margin of epiglottis is difficult to visualize. As far as seen epiglottis is not thickened. Prevertebral soft tissues are unremarkable. IMPRESSION: There is no significant narrowing of airways. Epiglottis is not thickened. Electronically Signed   By: Ernie Avena M.D.   On: 03/17/2022 11:04   ? ?ED Clinical Impression ? ?1. Infectious mononucleosis without complication, infectious mononucleosis due to unspecified organism   ?2. Streptococcal sore throat   ?3. Acute pharyngitis, unspecified etiology   ? ? ? ?ED Assessment/Plan ? ?Group A strep PCR negative.  Checking Monospot due to the posterior cervical lymphadenopathy, and a lateral soft tissue neck because of the mildly muffled voice.  No evidence of a peritonsillar abscess.  He has no neck stiffness, drooling, trismus.  Patient home with ibuprofen, Tylenol, Benadryl/Maalox mixture.  We will also add some Pepcid in case the sore throat is being caused by acid reflux.. Patient to followup with PCP of choice when necessary, will refer to local primary care resources.  Will order assistance in finding a  PCP. ? ?Patient does not have strep pharyngitis.  This was entered in error by staff. ? ?Reviewed imaging independently.  No airway narrowing, thickening of the epiglottis.  See radiology report for details. ? ?Mono pending at time of discharge.  Will contact patient if and only if positive. ? ?Mono positive.  Called patient and advised him and he will need to avoid activities where he could get hit in the abdomen.  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  Patient agrees with plan. ? ?Discussed labs,  MDM, plan and followup with patient. Discussed sn/sx that should  prompt return to the ED. patient agrees with plan.  ? ?Meds ordered this encounter  ?Medications  ? ibuprofen (ADVIL) 600 MG tablet  ?  Sig: Take 1 tablet (600 mg total) by mouth every 6 (six) hours as needed.  ?  Dispense:  30 tablet  ?  Refill:  0  ? ? ? ?*This clinic note was created using Scientist, clinical (histocompatibility and immunogenetics). Therefore, there may be occasional mistakes despite careful proofreading. ? ? ?  ?Domenick Gong, MD ?03/18/22 [phone] ? ?

## 2022-03-17 NOTE — ED Triage Notes (Signed)
Pt present sore throat with difficulty swallow. Symptoms started a  week ago. Pt has a cough with constant of productive.  ?

## 2022-03-17 NOTE — Discharge Instructions (Addendum)
Your neck x-ray was normal today.  Your strep PCR was negative today.  Your mono test is still pending.  I will contact you if and only if it is positive.  If you do not hear from you by the end of the day, you can assume that it is negative.  You can also call here and get your result.  1 gram of Tylenol and 600 mg ibuprofen together 3-4 times a day as needed for pain.  Make sure you drink plenty of extra fluids.  Some people find salt water gargles and  Traditional Medicinal's "Throat Coat" tea helpful. Take 5 mL of liquid Benadryl and 5 mL of Maalox. Mix it together, and then hold it in your mouth for as long as you can and then swallow. You may do this 4 times a day.   ? ?Here is a list of primary care providers who are taking new patients: ? ?[facility] [place] ?Dr. Rosette Reveal (sports medicine) ?Dr. Otilio Miu ?[address] ?[place] [address] ?[phone] ? ?[facility] ?[address], [place] [address] ?[phone] ? ?[facility] [place] ?[place] [address]  ?[phone] ? ?[facility] ?[place], [place] [address] ?[phone] ? ?[facility] ?[place]  ?[phone] ?[place], [place] [address] ? ?Here are clinics/ other resources who will see you if you do not have insurance. Some have certain criteria that you must meet. Call them and find out what they are: ? ?[facility]: ?[address], [place], [place] [address] ?Phone: [phone] ?Hours: First and Third Saturdays of each Month, 9 a.m. - 1 p.m. ? ?[facility]: ?[address], [place], [place], [place] [address] ?Phone: [phone] ?Hours: ?Tuesday, 4 p.m. - 8 p.m. ?Thursday, 1 p.m. - 8 p.m. ?Wednesday, 9 a.m. - Noon ? ?[facility] ?[address], [place], [place] [address] ?Phone: [phone] ?Pharmacy Phone Number: [phone] ?Dental Phone Number: [phone] ?[place] [facility] Help: [phone] ? ?Dental  Hours: ?Monday - Thursday, 8 a.m. - 6 p.m. ? ?[facility] ?[address] [place], [place] [address] ?Phone: [phone] ?Pharmacy Phone Number: [phone] ?[place] [facility] Help: [phone] ? ?[facility] ?[address], [place], [place] [address] ?Phone: [phone] ?Pharmacy Phone Number: [phone] ?[place] Insurance Help: [phone] ? ?[facility] ?[place], [place] [address] ?Phone: [phone] ?[place] Insurance Help: [phone]  ? ?[address] [place], [place] [address] ?Phone: [phone] ? ?Go to www.goodrx.com  or www.costplusdrugs.com to look up your medications. This will give you a list of where you can find your prescriptions at the most affordable prices. Or ask the pharmacist what the cash price is, or if they have any other discount programs available to help make your medication more affordable. This can be less expensive than what you would pay with insurance.   ?
